# Patient Record
Sex: Female | Born: 1975 | Race: Black or African American | Hispanic: No | Marital: Single
Health system: Southern US, Community
[De-identification: ages and names within clinical notes are randomized; demographics above are authoritative.]

---

## 2010-12-18 ENCOUNTER — Ambulatory Visit: Payer: Self-pay | Admitting: Internal Medicine

## 2010-12-23 ENCOUNTER — Ambulatory Visit: Payer: Self-pay | Admitting: General Surgery

## 2011-12-29 ENCOUNTER — Ambulatory Visit: Payer: Self-pay | Admitting: General Surgery

## 2012-01-03 ENCOUNTER — Ambulatory Visit: Payer: Self-pay | Admitting: General Surgery

## 2012-01-03 LAB — PREGNANCY, URINE: Pregnancy Test, Urine: NEGATIVE m[IU]/mL

## 2013-03-29 IMAGING — CT CT ABD-PELV W/ CM
1 of 2 series · 15 of 32 positions shown, 19 images · IV contrast (isovue)
Comparison: 12/18/2010

REASON FOR EXAM: abd pain  intussusection  pelvic pain
COMMENTS:

PROCEDURE:     KCT - KCT ABDOMEN/PELVIS W  - December 29, 2011  [DATE]
RESULT:     History: Abdominal pain
TECHNIQUE: Multiple axial images of the abdomen and pelvis were performed
from the lung bases to the pubic symphysis, with p.o. contrast and with 100
ml of Isovue 370 intravenous contrast.

[Series 2: abd 3mm w 3.0 i40f 3 · axial · 0.84mm/px · z∈[-744,-384]mm · 15 of 136 slices shown, 19 images]
[im 11/136  soft-tissue]
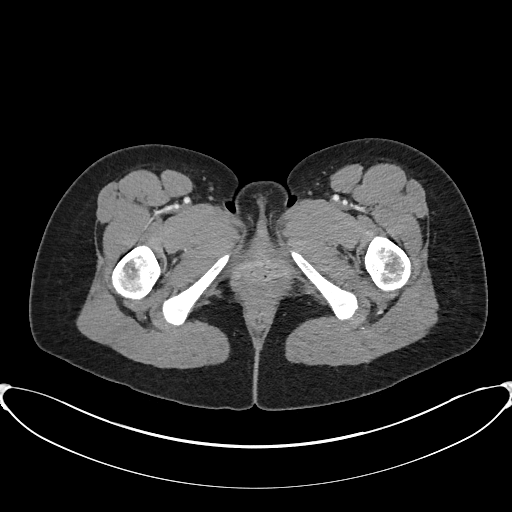
[im 11/136  bone]
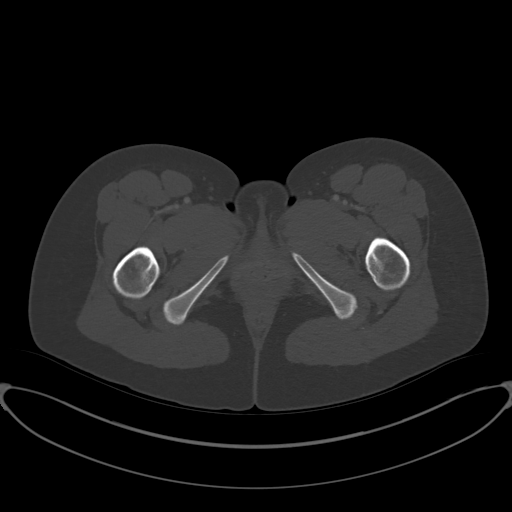
[im 21/136  soft-tissue]
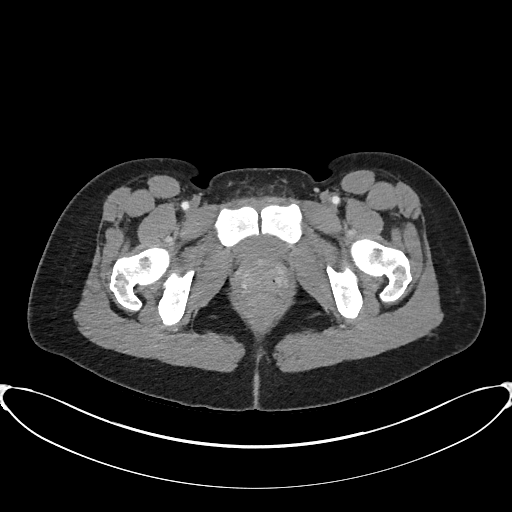
[im 31/136  soft-tissue]
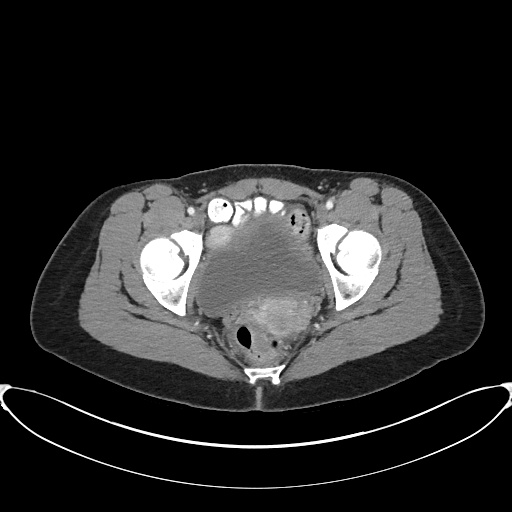
[im 41/136  soft-tissue]
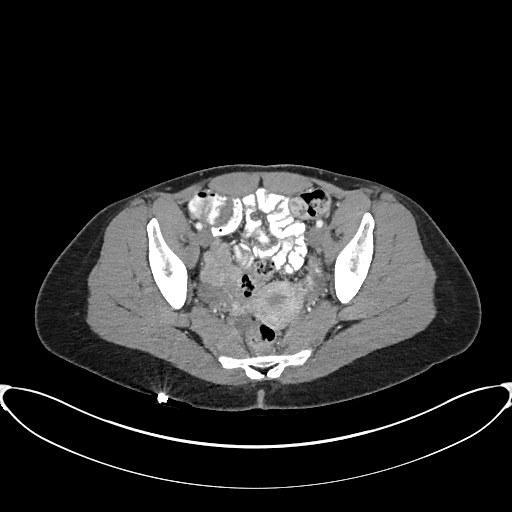
[im 51/136  soft-tissue]
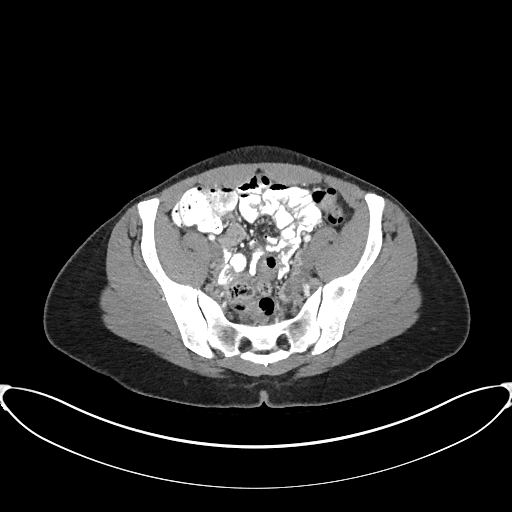
[im 61/136  soft-tissue]
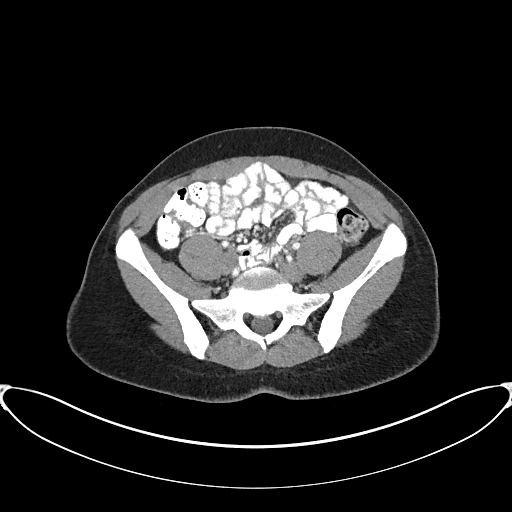
[im 71/136  soft-tissue]
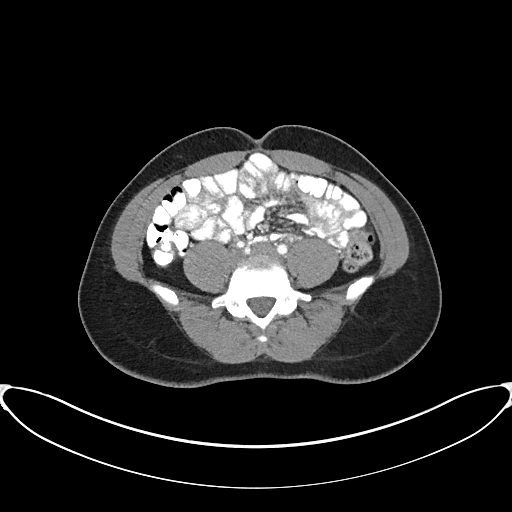
[im 81/136  soft-tissue]
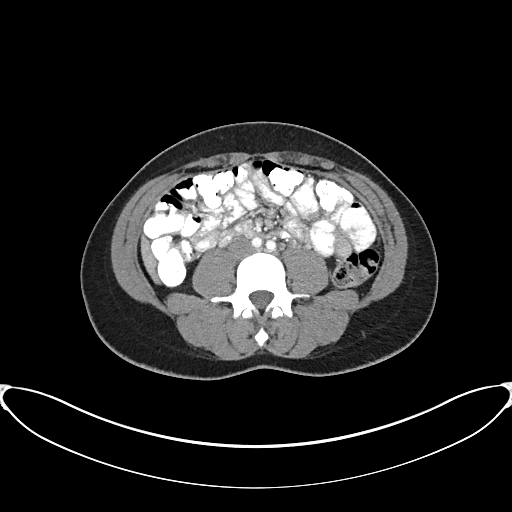
[im 91/136  soft-tissue]
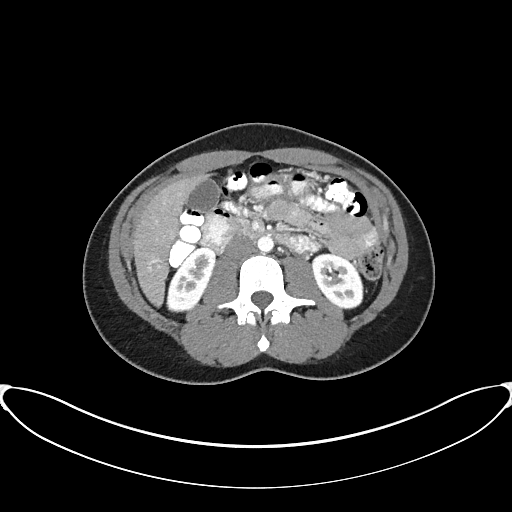
[im 91/136  bone]
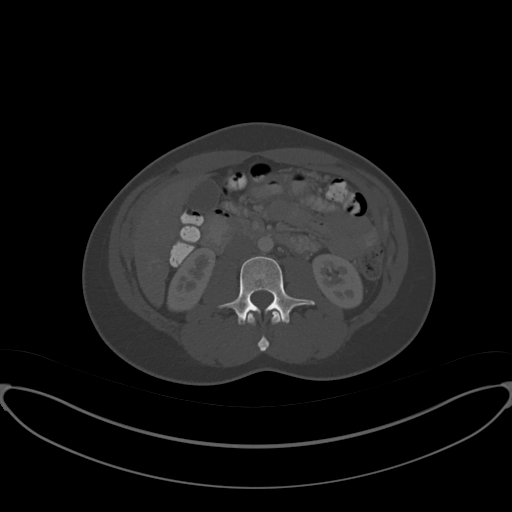
[im 101/136  soft-tissue]
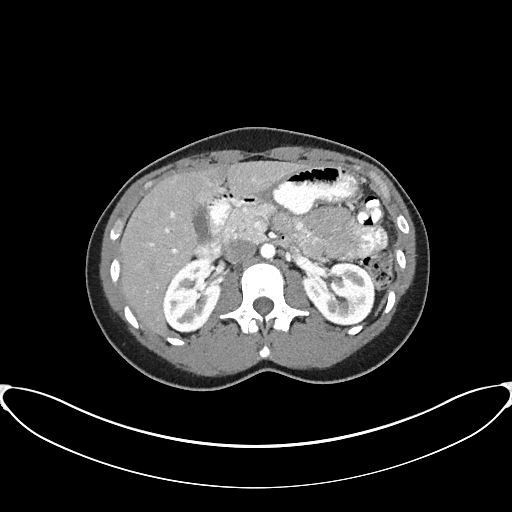
[im 111/136  soft-tissue]
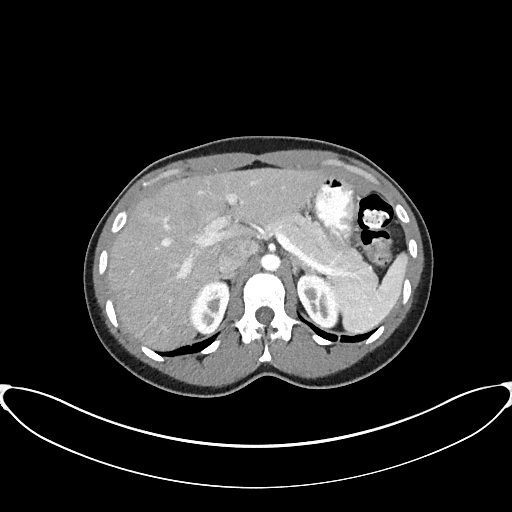
[im 116/136  lung]
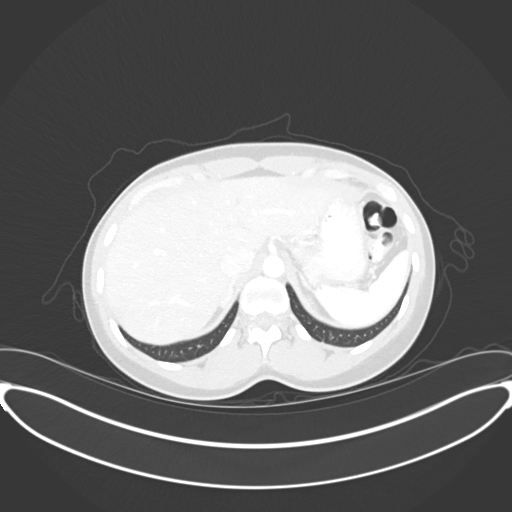
[im 121/136  soft-tissue]
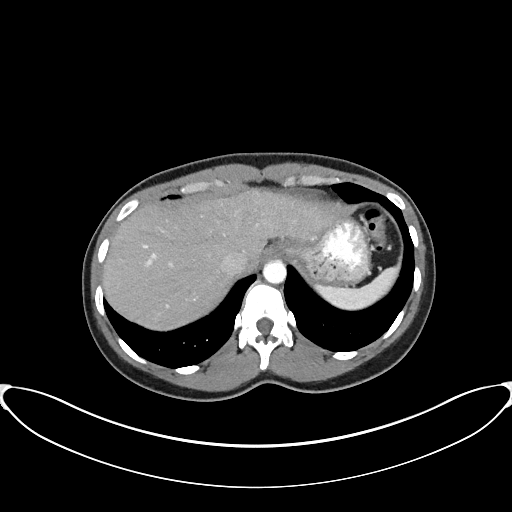
[im 121/136  lung]
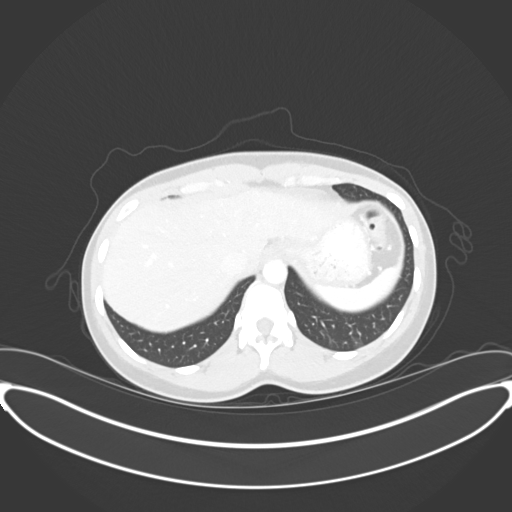
[im 126/136  lung]
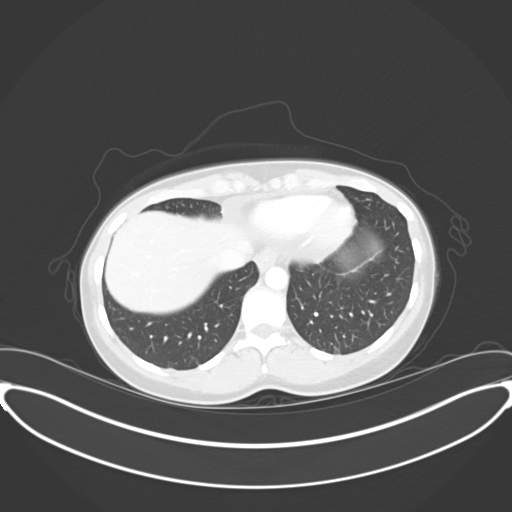
[im 131/136  soft-tissue]
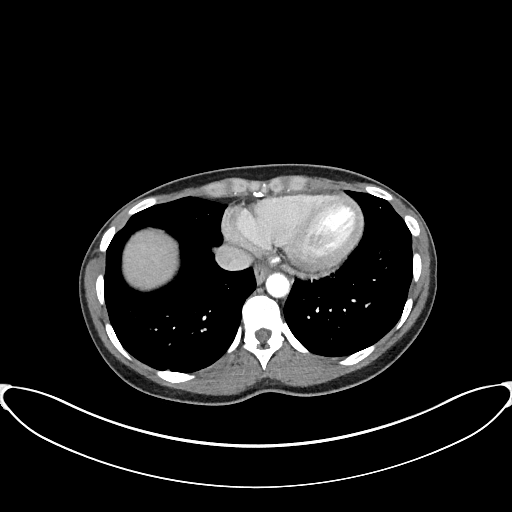
[im 131/136  lung]
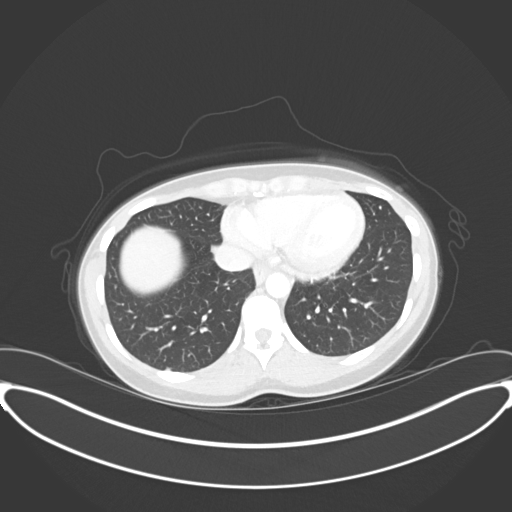

[15 of 32 positions shown; findings below may reference images not displayed]

FINDINGS: The lung bases are clear. There is no pneumothorax. The heart size is
normal.

The liver demonstrates no focal abnormality. There is no intrahepatic or
extrahepatic biliary ductal dilatation. The gallbladder is unremarkable. The
spleen demonstrates no focal abnormality. The kidneys, adrenal glands, and
pancreas are normal. The bladder is unremarkable.

The stomach, duodenum, small intestine, and large intestine demonstrate no
contrast extravasation or dilatation.  There is no pneumoperitoneum,
pneumatosis, or portal venous gas. There is no abdominal or pelvic free
fluid. There is no lymphadenopathy.

The abdominal aorta is normal in caliber.

The osseous structures are unremarkable.
IMPRESSION: 1. No acute abdominal or pelvic pathology.

[REDACTED]

## 2014-11-24 NOTE — Op Note (Signed)
PATIENT NAME:  Melissa Edwards, Melissa Edwards MR#:  [id number] DATE OF BIRTH:  1976/06/17  DATE OF PROCEDURE:  01/03/2012  PREOPERATIVE DIAGNOSIS: Left upper quadrant abdominal pain.   POSTOPERATIVE DIAGNOSIS: Left upper quadrant abdominal pain.   OPERATIVE PROCEDURE: Diagnostic laparoscopy.   OPERATING SURGEON: Earline MayotteJeffrey W. Byrnett, MD   ANESTHESIA: General endotracheal under Dr. Hilma Favorsice    ESTIMATED BLOOD LOSS: None.   CLINICAL NOTE: This 39 year old woman has had several episodes of severe left upper quadrant pain. A CT scan obtained in early 2012 suggested a focal area of intussusception now confirmed on follow-up scans or small bowel follow-through. She recently had another episode of pain after an eight month hiatus of being symptom-free. CT scan completed last week was notable only for a small fascial defect at the umbilicus. She was felt to be a candidate for diagnostic laparoscopy.   OPERATIVE NOTE: With the patient under adequate general endotracheal anesthesia, the abdomen was prepped with ChloraPrep and draped. Because of the umbilical defect, it was elected to make use of a Hassan technique. The skin was incised sharply and the subcutaneous tissue divided with a hemostat. The peritoneum was entered and a Hassan cannula placed. The abdomen was insufflated with CO2 at 12 mmHg pressure. A 10 mm step-port was expanded in the epigastrium and a 5 mm step-port was expanded in the left lower quadrant. The patient was placed into reverse Trendelenburg position and rolled to the left. The transverse colon and omentum were pushed superiorly and the ligament of Treitz was identified. The small bowel was then followed from this location to the terminal ileum. There was no evidence of bowel wall thickening, mucosal inflammation, or mesenteric thickening. No evidence of venous dilatation or any other changes to suggest recent intussusception. A few small normal sized lymph nodes were visible in the thin mesentery of the  terminal ileum. The examined colon, liver, gallbladder, and stomach were unremarkable. Right and left tubes and ovaries were unremarkable. The appendix was visualized and showed no evidence of chronic inflammation or thickening.   The 10 mm port site in the epigastrium was closed with a single 0 PDS transfacial suture. The umbilical defect was then closed with an 0 PDS figure-of-eight suture. Skin incisions were closed with 4-0 Vicryl subcuticular sutures. Benzoin, Steri-Strips, Telfa, and Tegaderm dressings were then applied.   The patient tolerated the procedure well and was taken to the recovery room in stable condition.   ____________________________ Earline MayotteJeffrey W. Byrnett, MD jwb:drc D: 01/03/2012 09:40:46 ET T: 01/03/2012 10:13:12 ET JOB#: [id number]  cc: Earline MayotteJeffrey W. Byrnett, MD, <Dictator> Teena Iraniavid M. Terance HartBronstein, MD JEFFREY Brion AlimentW BYRNETT MD ELECTRONICALLY SIGNED 01/03/2012 20:38
